# Patient Record
Sex: Male | Born: 1940 | Race: Black or African American | Hispanic: No | Marital: Married | Smoking: Former smoker
Health system: Southern US, Community
[De-identification: ages and names within clinical notes are randomized; demographics above are authoritative.]

---

## 2013-01-27 ENCOUNTER — Emergency Department: Payer: Self-pay | Admitting: Emergency Medicine

## 2014-11-19 IMAGING — CR DG CHEST 2V
1 series · 2 of 2 positions shown · non-contrast
Comparison: none

REASON FOR EXAM: fever, cough
COMMENTS:

PROCEDURE:     DXR - DXR CHEST PA (OR AP) AND LATERAL  - January 27, 2013  [DATE]
RESULT:     The lungs are adequately inflated. There is no focal infiltrate.
The cardiac silhouette is normal in size. The pulmonary vascularity is not
engorged. There is tortuosity of the descending thoracic aorta.

[Series 1: w chest pa · 0.14mm/px · 2 of 2 slices shown]
[im 1/2]
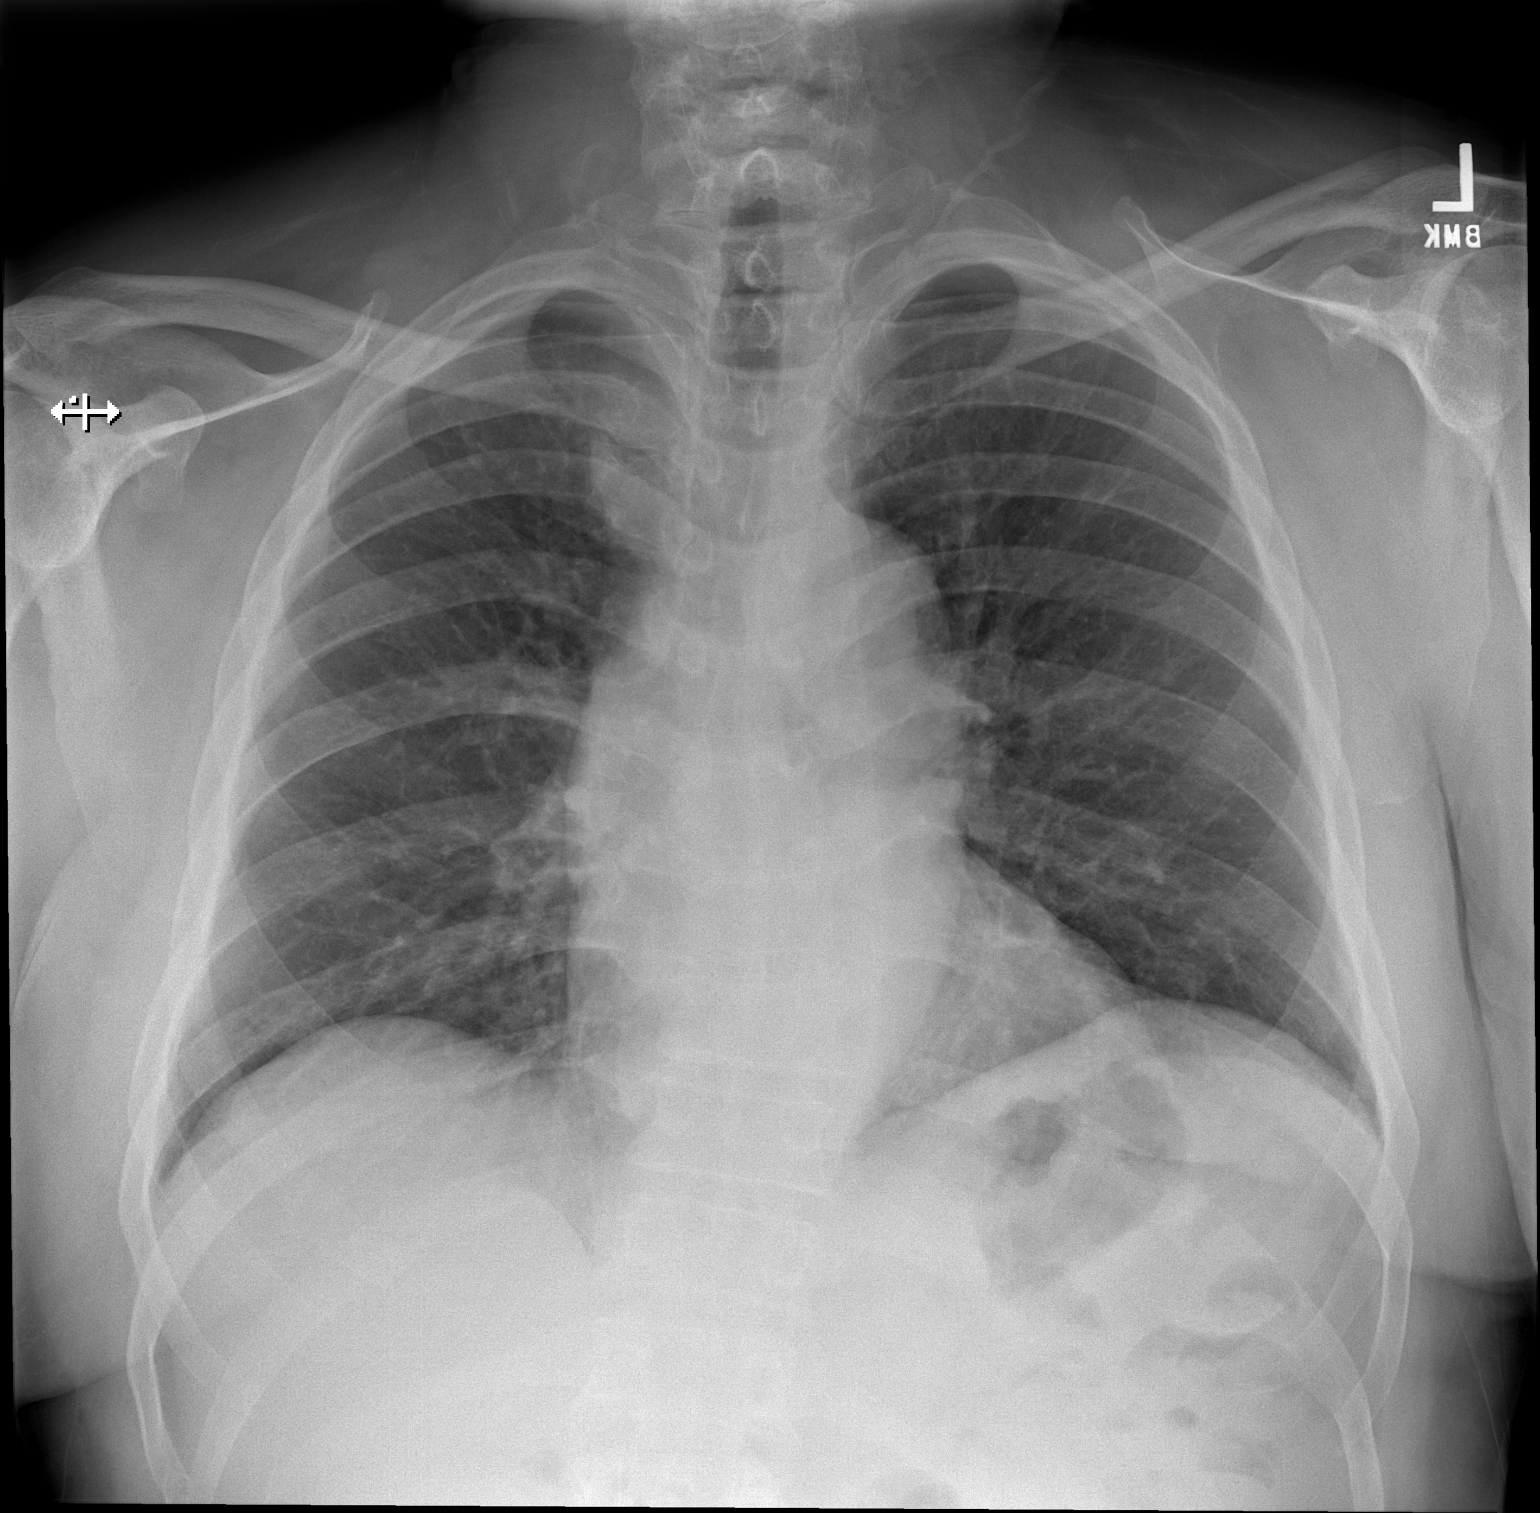
[im 2/2]
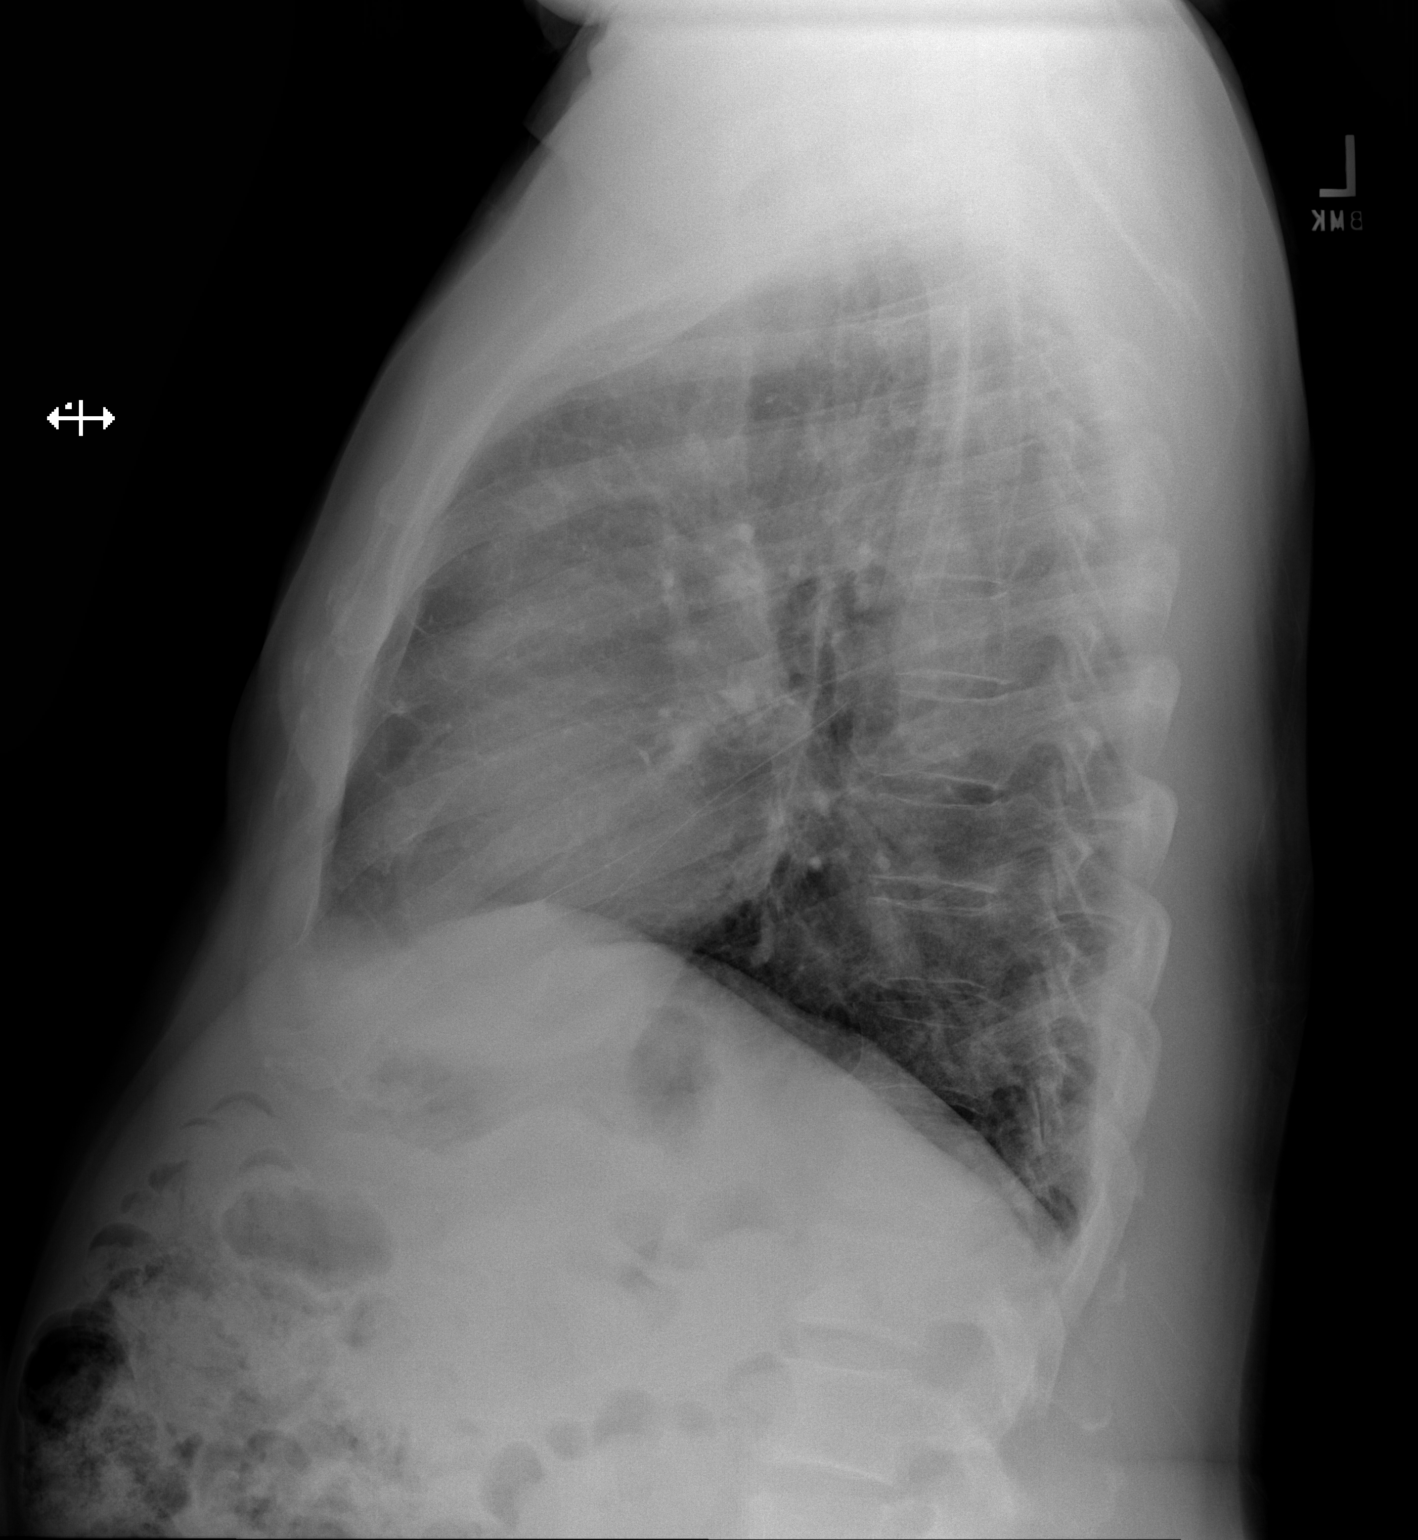

[2 of 2 positions shown; findings below may reference images not displayed]

IMPRESSION: There is no evidence of pneumonia. I cannot exclude acute
bronchitis in the appropriate clinical setting.

[REDACTED]

## 2018-02-17 ENCOUNTER — Ambulatory Visit (INDEPENDENT_AMBULATORY_CARE_PROVIDER_SITE_OTHER): Payer: Non-veteran care | Admitting: Podiatry

## 2018-02-17 ENCOUNTER — Encounter: Payer: Self-pay | Admitting: Podiatry

## 2018-02-17 DIAGNOSIS — B351 Tinea unguium: Secondary | ICD-10-CM | POA: Diagnosis not present

## 2018-02-17 DIAGNOSIS — M79674 Pain in right toe(s): Secondary | ICD-10-CM | POA: Diagnosis not present

## 2018-02-17 DIAGNOSIS — E119 Type 2 diabetes mellitus without complications: Secondary | ICD-10-CM

## 2018-02-17 DIAGNOSIS — M79675 Pain in left toe(s): Secondary | ICD-10-CM | POA: Diagnosis not present

## 2018-02-17 NOTE — Progress Notes (Signed)
This patient presents to the office with chief complaint of long thick nails and diabetic feet.  This patient  says there  is  no pain and discomfort in his  feet.  This patient says there are long thick painful nails.  These nails are painful walking and wearing shoes.  Patient has no history of infection or drainage from both feet.  Patient is unable to  self treat his own nails . This patient presents  to the office today for treatment of the  long nails and a foot evaluation due to history of  diabetes.  General Appearance  Alert, conversant and in no acute stress.  Vascular  Dorsalis pedis and posterior tibial  pulses are palpable  bilaterally.  Capillary return is within normal limits  bilaterally. Temperature is within normal limits  bilaterally.  Neurologic  Senn-Weinstein monofilament wire test within normal limits  bilaterally. Muscle power within normal limits bilaterally.  Nails Thick disfigured discolored nails with subungual debris  from hallux to fifth toes bilaterally. No evidence of bacterial infection or drainage bilaterally.  Orthopedic  No limitations of motion of motion feet .  No crepitus or effusions noted.  No bony pathology or digital deformities noted.  Skin  normotropic skin with no porokeratosis noted bilaterally.  No signs of infections or ulcers noted.     Onychomycosis  Diabetes with no foot complications  IE  Debride nails x 10.  A diabetic foot exam was performed and there is no evidence of any vascular or neurologic pathology.   RTC 3 months.   Klye Besecker DPM  

## 2018-02-22 ENCOUNTER — Telehealth: Payer: Self-pay | Admitting: Podiatry

## 2018-02-22 NOTE — Telephone Encounter (Signed)
Please send [facility] notes to Fax [phone]

## 2018-05-26 ENCOUNTER — Ambulatory Visit: Payer: Non-veteran care | Admitting: Podiatry
